# Patient Record
Sex: Male | Born: 1950 | Race: White | Hispanic: No | Marital: Single | Smoking: Current every day smoker
Health system: Southern US, Community
[De-identification: ages and names within clinical notes are randomized; demographics above are authoritative.]

## PROBLEM LIST (undated history)

## (undated) DIAGNOSIS — G8929 Other chronic pain: Secondary | ICD-10-CM

## (undated) DIAGNOSIS — M549 Dorsalgia, unspecified: Secondary | ICD-10-CM

## (undated) DIAGNOSIS — I10 Essential (primary) hypertension: Secondary | ICD-10-CM

## (undated) DIAGNOSIS — F17200 Nicotine dependence, unspecified, uncomplicated: Secondary | ICD-10-CM

## (undated) HISTORY — PX: LUMBAR FUSION: SHX111

---

## 2014-05-07 ENCOUNTER — Emergency Department (HOSPITAL_COMMUNITY)
Admission: EM | Admit: 2014-05-07 | Discharge: 2014-05-07 | Disposition: A | Discharge status: Home / Self Care | Payer: Self-pay | Attending: Emergency Medicine | Admitting: Emergency Medicine

## 2014-05-07 ENCOUNTER — Encounter (HOSPITAL_COMMUNITY): Payer: Self-pay | Admitting: Emergency Medicine

## 2014-05-07 ENCOUNTER — Emergency Department (HOSPITAL_COMMUNITY): Payer: Self-pay

## 2014-05-07 DIAGNOSIS — J209 Acute bronchitis, unspecified: Secondary | ICD-10-CM | POA: Insufficient documentation

## 2014-05-07 DIAGNOSIS — F17200 Nicotine dependence, unspecified, uncomplicated: Secondary | ICD-10-CM | POA: Insufficient documentation

## 2014-05-07 DIAGNOSIS — J40 Bronchitis, not specified as acute or chronic: Secondary | ICD-10-CM

## 2014-05-07 DIAGNOSIS — Z880 Allergy status to penicillin: Secondary | ICD-10-CM | POA: Insufficient documentation

## 2014-05-07 LAB — COMPREHENSIVE METABOLIC PANEL
ALBUMIN: 3.6 g/dL (ref 3.5–5.2)
ALK PHOS: 108 U/L (ref 39–117)
ALT: 28 U/L (ref 0–53)
AST: 30 U/L (ref 0–37)
BUN: 10 mg/dL (ref 6–23)
CALCIUM: 8.6 mg/dL (ref 8.4–10.5)
CO2: 18 mEq/L — ABNORMAL LOW (ref 19–32)
Chloride: 101 mEq/L (ref 96–112)
Creatinine, Ser: 0.98 mg/dL (ref 0.50–1.35)
GFR calc Af Amer: >90 mL/min (ref >90)
GFR calc non Af Amer: 87 mL/min — ABNORMAL LOW (ref >90)
Glucose, Bld: 97 mg/dL (ref 70–99)
POTASSIUM: 4.3 meq/L (ref 3.7–5.3)
Sodium: 135 mEq/L — ABNORMAL LOW (ref 137–147)
TOTAL PROTEIN: 8.5 g/dL — AB (ref 6.0–8.3)
Total Bilirubin: 0.7 mg/dL (ref 0.3–1.2)

## 2014-05-07 LAB — I-STAT TROPONIN, ED: Troponin i, poc: 0.01 ng/mL (ref 0.00–0.08)

## 2014-05-07 LAB — CBC
HCT: 44.2 % (ref 39.0–52.0)
Hemoglobin: 15.2 g/dL (ref 13.0–17.0)
MCH: 32.9 pg (ref 26.0–34.0)
MCHC: 34.4 g/dL (ref 30.0–36.0)
MCV: 95.7 fL (ref 78.0–100.0)
PLATELETS: 168 10*3/uL (ref 150–400)
RBC: 4.62 MIL/uL (ref 4.22–5.81)
RDW: 14.2 % (ref 11.5–15.5)
WBC: 7 10*3/uL (ref 4.0–10.5)

## 2014-05-07 MED ORDER — ALBUTEROL SULFATE HFA 108 (90 BASE) MCG/ACT IN AERS
2.0000 | INHALATION_SPRAY | Freq: Once | RESPIRATORY_TRACT | Status: AC
  Administered 2014-05-07: 2 via RESPIRATORY_TRACT
  Filled 2014-05-07: qty 6.7

## 2014-05-07 MED ORDER — IBUPROFEN 400 MG PO TABS
600.0000 mg | ORAL_TABLET | Freq: Once | ORAL | Status: AC
  Administered 2014-05-07: 600 mg via ORAL
  Filled 2014-05-07 (×2): qty 1

## 2014-05-07 MED ORDER — AZITHROMYCIN 250 MG PO TABS: 250.0000 mg | ORAL_TABLET | Freq: Every day | ORAL | Status: DC

## 2014-05-07 MED ORDER — SODIUM CHLORIDE 0.9 % IV BOLUS (SEPSIS)
1000.0000 mL | Freq: Once | INTRAVENOUS | Status: AC
  Administered 2014-05-07: 1000 mL via INTRAVENOUS

## 2014-05-07 NOTE — ED Notes (Signed)
Pt c/o a sudden onset of mid sharp non radiating chest pain starting this morning. Pt states he was sitting when pain started. Pt reports increase pain with deep inspiration. Denies n/v, diaphoresis, lightheadedness, dizziness. Pt is 1/2 PPD smoker

## 2014-05-07 NOTE — ED Provider Notes (Signed)
CSN: [id number]     Arrival date & time 05/07/14  2007 History   First MD Initiated Contact with Patient 05/07/14 2106     Chief Complaint  Patient presents with  . Chest Pain  . Shortness of Breath     (Consider location/radiation/quality/duration/timing/severity/associated sxs/prior Treatment) Patient is a 63 y.o. male presenting with chest pain. The history is provided by the patient.  Chest Pain Chest pain location: sternum. Pain quality: sharp   Pain radiates to:  Does not radiate Pain radiates to the back: no   Pain severity:  Moderate Onset quality:  Gradual Duration:  1 day Timing:  Constant Progression:  Worsening Chronicity:  Recurrent Context: breathing (and with cough) and at rest   Relieved by:  Nothing Worsened by:  Nothing tried Ineffective treatments:  None tried Associated symptoms: cough and shortness of breath   Associated symptoms: no abdominal pain, no fever, no headache, no palpitations and not vomiting   Risk factors: hypertension and smoking   Risk factors: no coronary artery disease     History reviewed. No pertinent past medical history. History reviewed. No pertinent past surgical history. History reviewed. No pertinent family history. History  Substance Use Topics  . Smoking status: Current Every Day Smoker -- 0.50 packs/day    Types: Cigarettes  . Smokeless tobacco: Former NeurosurgeonUser  . Alcohol Use: Yes    Review of Systems  Constitutional: Negative for fever.  Respiratory: Positive for cough and shortness of breath.   Cardiovascular: Positive for chest pain. Negative for palpitations.  Gastrointestinal: Negative for vomiting and abdominal pain.  Neurological: Negative for headaches.  All other systems reviewed and are negative.     Allergies  Penicillins  Home Medications   Prior to Admission medications   Not on File   BP 167/88  Pulse 71  Temp(Src) 98.4 F (36.9 C) (Oral)  Resp 20  Ht 5\' 11"  (1.803 m)  Wt 210 lb (95.255  kg)  BMI 29.30 kg/m2  SpO2 99% Physical Exam  Constitutional: He is oriented to person, place, and time. He appears well-developed and well-nourished. No distress.  HENT:  Head: Normocephalic and atraumatic.  Eyes: Conjunctivae are normal.  Neck: Neck supple. No tracheal deviation present.  Cardiovascular: Normal rate, regular rhythm and normal heart sounds.   Pulmonary/Chest: Effort normal. No respiratory distress. He has no wheezes. He has rales (bibasilar). He exhibits no tenderness.  Abdominal: Soft. He exhibits no distension. There is no tenderness. There is no rebound and no guarding.  Neurological: He is alert and oriented to person, place, and time.  Skin: Skin is warm and dry.  Psychiatric: He has a normal mood and affect.    ED Course  Procedures (including critical care time) Labs Review Labs Reviewed  COMPREHENSIVE METABOLIC PANEL - Abnormal; Notable for the following:    Sodium 135 (*)    CO2 18 (*)    Total Protein 8.5 (*)    GFR calc non Af Amer 87 (*)    All other components within normal limits  CBC  I-STAT TROPOININ, ED    Imaging Review Dg Chest 2 View  05/07/2014   CLINICAL DATA:  Sudden onset of sharp non radiating mid chest pain this morning, increased with deep inspiration  EXAM: CHEST  2 VIEW  COMPARISON:  None  FINDINGS: Enlargement of cardiac silhouette.  Tortuous aorta.  Pulmonary vascularity normal.  Bronchitic changes with bibasilar atelectasis greater on RIGHT.  No definite infiltrate, pleural effusion or pneumothorax.  Bones  unremarkable.  IMPRESSION: Enlargement of cardiac silhouette.  Bronchitic changes with bibasilar atelectasis greater on RIGHT.   Electronically Signed   By: Ulyses SouthwardMark  Boles M.D.   On: 05/07/2014 21:41     EKG Interpretation   Date/Time:  Tuesday May 07 2014 20:12:30 EDT Ventricular Rate:  80 PR Interval:  134 QRS Duration: 128 QT Interval:  432 QTC Calculation: 498 R Axis:   0 Text Interpretation:  Normal sinus rhythm Right  bundle branch block  Abnormal ECG No previous ECGs available Confirmed by YAO  MD, DAVID  ([id number]) on 05/07/2014 8:51:10 PM      MDM   Final diagnoses:  Bronchitis   63 y.o. male presents with cough and pain with inspiration that started today. He has had symptoms that were similar in the past and received antibiotics with improvement. He was unable to make an appointment with his regular physician today so comes here for evaluation. He is experiencing a sharp sternal pain that is worse with coughing and with deep inspiration. He is a heavy smoker and been noncompliant hypertensive. He is afebrile on arrival, his chest x-ray is concerning for infection but does show some bibasilar atelectasis and changes of bronchitis that would be consistent with his clinical picture today. His troponin is negative and I do not feel that this pain is concerning for ACS. The patient was prescribed antibiotics to cover for atypicals in the setting of mild bronchitis and I recommended NSAIDs for his apparent sternal pain. He was given an albuterol inhaler for comfort at home and asked to follow up with primary care physician within the week for recheck. Notably on his screening lab work he had a mildly low bicarbonate which could be attributable to alcohol use or dehydration and he was given an IV fluid bolus to correct this.    Lyndal Pulleyaniel Kushi Kun, MD 05/08/14 [phone]

## 2014-05-07 NOTE — ED Notes (Signed)
Patient is alert and orientedx4.  Patient was explained discharge instructions and they understood them with no questions.  The patient's maid, Franchot MimesSharon Robbins is taking the patient home.

## 2014-05-07 NOTE — Discharge Instructions (Signed)

## 2014-05-07 NOTE — ED Notes (Signed)
Patient transported to X-ray 

## 2014-05-08 NOTE — ED Provider Notes (Signed)
I saw and evaluated the patient, reviewed the resident's note and I agree with the findings and plan.   EKG Interpretation   Date/Time:  Tuesday May 07 2014 20:12:30 EDT Ventricular Rate:  80 PR Interval:  134 QRS Duration: 128 QT Interval:  432 QTC Calculation: 498 R Axis:   0 Text Interpretation:  Normal sinus rhythm Right bundle branch block  Abnormal ECG No previous ECGs available Confirmed by Joziah Dollins  MD, Aarush Stukey  ([id number]) on 05/07/2014 8:51:10 PM      Donald Watkins is a 63 y.o. male smoker here with cough. Cough and congestion and chest pain worse with inspiration today. Had bronchitis before that improved with abx. Vitals stable. + wheezing R side and diminished breath sounds R base. He also drinks alcohol so bicarb 18. Given 1L NS and nebs. Given that he is a smoker, will d/c home on zpack and prn albuterol.    Donald Canalavid H Yvonda Fouty, MD 05/08/14 [phone]

## 2018-07-20 ENCOUNTER — Other Ambulatory Visit: Payer: Self-pay | Admitting: Physician Assistant

## 2018-07-20 DIAGNOSIS — Z136 Encounter for screening for cardiovascular disorders: Secondary | ICD-10-CM

## 2018-07-27 ENCOUNTER — Ambulatory Visit
Admission: RE | Admit: 2018-07-27 | Discharge: 2018-07-27 | Disposition: A | Discharge status: Home / Self Care | Payer: Medicare Other | Source: Ambulatory Visit | Attending: Physician Assistant | Admitting: Physician Assistant

## 2018-07-27 DIAGNOSIS — Z136 Encounter for screening for cardiovascular disorders: Secondary | ICD-10-CM

## 2018-10-27 ENCOUNTER — Encounter (HOSPITAL_BASED_OUTPATIENT_CLINIC_OR_DEPARTMENT_OTHER): Payer: Self-pay | Admitting: *Deleted

## 2018-10-27 ENCOUNTER — Other Ambulatory Visit: Payer: Self-pay

## 2018-10-31 ENCOUNTER — Encounter (HOSPITAL_BASED_OUTPATIENT_CLINIC_OR_DEPARTMENT_OTHER)
Admission: RE | Admit: 2018-10-31 | Discharge: 2018-10-31 | Disposition: A | Discharge status: Home / Self Care | Payer: Medicare Other | Source: Ambulatory Visit | Attending: Oral Surgery | Admitting: Oral Surgery

## 2018-10-31 DIAGNOSIS — Z01812 Encounter for preprocedural laboratory examination: Secondary | ICD-10-CM | POA: Diagnosis present

## 2018-10-31 DIAGNOSIS — I10 Essential (primary) hypertension: Secondary | ICD-10-CM | POA: Diagnosis not present

## 2018-10-31 DIAGNOSIS — K029 Dental caries, unspecified: Secondary | ICD-10-CM | POA: Diagnosis present

## 2018-10-31 DIAGNOSIS — F17200 Nicotine dependence, unspecified, uncomplicated: Secondary | ICD-10-CM | POA: Diagnosis not present

## 2018-10-31 DIAGNOSIS — K0530 Chronic periodontitis, unspecified: Secondary | ICD-10-CM | POA: Diagnosis not present

## 2018-10-31 LAB — BASIC METABOLIC PANEL
Anion gap: 8 (ref 5–15)
BUN: 16 mg/dL (ref 8–23)
CALCIUM: 8.9 mg/dL (ref 8.9–10.3)
CO2: 28 mmol/L (ref 22–32)
CREATININE: 2.54 mg/dL — AB (ref 0.61–1.24)
Chloride: 101 mmol/L (ref 98–111)
GFR calc non Af Amer: 25 mL/min — ABNORMAL LOW (ref >60)
GFR, EST AFRICAN AMERICAN: 29 mL/min — AB (ref >60)
Glucose, Bld: 85 mg/dL (ref 70–99)
Potassium: 3.9 mmol/L (ref 3.5–5.1)
SODIUM: 137 mmol/L (ref 135–145)

## 2018-10-31 NOTE — H&P (Signed)
HISTORY AND PHYSICAL  Donald Watkins is a 67 y.o. male patient with CC: referred by general dentist for full mouth extractions.   No diagnosis found.  Past Medical History:  Diagnosis Date  . Chronic back pain    [facility]  . Hypertension   . Smoker     No current facility-administered medications for this encounter.    Current Outpatient Medications  Medication Sig Dispense Refill  . hydrochlorothiazide (HYDRODIURIL) 25 MG tablet Take 25 mg by mouth daily.    Marland Kitchen. HYDROcodone-acetaminophen (NORCO/VICODIN) 5-325 MG tablet Take 1 tablet by mouth every 6 (six) hours as needed for moderate pain.    Marland Kitchen. losartan (COZAAR) 100 MG tablet Take 100 mg by mouth daily.    Marland Kitchen. tiZANidine (ZANAFLEX) 4 MG tablet Take 4 mg by mouth every 6 (six) hours as needed for muscle spasms.     Allergies  Allergen Reactions  . Penicillins Hives   Active Problems:   * No active hospital problems. *  Vitals: Height 5\' 10"  (1.778 m), weight 88 kg. Lab results:No results found for this or any previous visit (from the past 24 hour(s)). Radiology Results: No results found. General appearance: alert and cooperative Head: Normocephalic, without obvious abnormality, atraumatic Eyes: negative Nose: Nares normal. Septum midline. Mucosa normal. No drainage or sinus tenderness. Throat: rampant dental caries, periodontitis. Pharynx clear. 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   Plan: Full mouth extractions with alveoloplasty. GA. Day surgery.    Donald Watkins 10/31/2018

## 2018-11-01 ENCOUNTER — Encounter (HOSPITAL_BASED_OUTPATIENT_CLINIC_OR_DEPARTMENT_OTHER)
Admission: RE | Disposition: A | Discharge status: Home / Self Care | Payer: Self-pay | Source: Ambulatory Visit | Attending: Oral Surgery

## 2018-11-01 ENCOUNTER — Other Ambulatory Visit: Payer: Self-pay

## 2018-11-01 ENCOUNTER — Ambulatory Visit (HOSPITAL_BASED_OUTPATIENT_CLINIC_OR_DEPARTMENT_OTHER)
Admission: RE | Admit: 2018-11-01 | Discharge: 2018-11-01 | Disposition: A | Discharge status: Home / Self Care | Payer: Medicare Other | Source: Ambulatory Visit | Attending: Oral Surgery | Admitting: Oral Surgery

## 2018-11-01 ENCOUNTER — Encounter (HOSPITAL_BASED_OUTPATIENT_CLINIC_OR_DEPARTMENT_OTHER): Payer: Self-pay | Admitting: *Deleted

## 2018-11-01 ENCOUNTER — Ambulatory Visit (HOSPITAL_BASED_OUTPATIENT_CLINIC_OR_DEPARTMENT_OTHER): Payer: Medicare Other | Admitting: Anesthesiology

## 2018-11-01 DIAGNOSIS — K0530 Chronic periodontitis, unspecified: Secondary | ICD-10-CM | POA: Insufficient documentation

## 2018-11-01 DIAGNOSIS — F17200 Nicotine dependence, unspecified, uncomplicated: Secondary | ICD-10-CM | POA: Diagnosis not present

## 2018-11-01 DIAGNOSIS — I10 Essential (primary) hypertension: Secondary | ICD-10-CM | POA: Insufficient documentation

## 2018-11-01 DIAGNOSIS — K029 Dental caries, unspecified: Secondary | ICD-10-CM | POA: Diagnosis not present

## 2018-11-01 HISTORY — DX: Nicotine dependence, unspecified, uncomplicated: F17.200

## 2018-11-01 HISTORY — PX: MULTIPLE EXTRACTIONS WITH ALVEOLOPLASTY: SHX5342

## 2018-11-01 HISTORY — DX: Essential (primary) hypertension: I10

## 2018-11-01 HISTORY — DX: Other chronic pain: G89.29

## 2018-11-01 HISTORY — DX: Dorsalgia, unspecified: M54.9

## 2018-11-01 LAB — POCT I-STAT, CHEM 8
BUN: 17 mg/dL (ref 8–23)
CHLORIDE: 103 mmol/L (ref 98–111)
Calcium, Ion: 1.08 mmol/L — ABNORMAL LOW (ref 1.15–1.40)
Creatinine, Ser: 1.5 mg/dL — ABNORMAL HIGH (ref 0.61–1.24)
GLUCOSE: 94 mg/dL (ref 70–99)
HCT: 46 % (ref 39.0–52.0)
Hemoglobin: 15.6 g/dL (ref 13.0–17.0)
POTASSIUM: 3 mmol/L — AB (ref 3.5–5.1)
SODIUM: 139 mmol/L (ref 135–145)
TCO2: 25 mmol/L (ref 22–32)

## 2018-11-01 SURGERY — MULTIPLE EXTRACTION WITH ALVEOLOPLASTY
Anesthesia: General | Laterality: Bilateral

## 2018-11-01 MED ORDER — MIDAZOLAM HCL 2 MG/2ML IJ SOLN
1.0000 mg | INTRAMUSCULAR | Status: DC | PRN
  Administered 2018-11-01: 2 mg via INTRAVENOUS

## 2018-11-01 MED ORDER — SUCCINYLCHOLINE CHLORIDE 20 MG/ML IJ SOLN
INTRAMUSCULAR | Status: DC | PRN
  Administered 2018-11-01: 100 mg via INTRAVENOUS

## 2018-11-01 MED ORDER — CLINDAMYCIN PHOSPHATE 600 MG/50ML IV SOLN
600.0000 mg | Freq: Once | INTRAVENOUS | Status: AC
  Administered 2018-11-01: 600 mg via INTRAVENOUS

## 2018-11-01 MED ORDER — LIDOCAINE-EPINEPHRINE 2 %-1:100000 IJ SOLN
INTRAMUSCULAR | Status: DC | PRN
  Administered 2018-11-01: 20 mL

## 2018-11-01 MED ORDER — MIDAZOLAM HCL 2 MG/2ML IJ SOLN
INTRAMUSCULAR | Status: AC
  Filled 2018-11-01: qty 2

## 2018-11-01 MED ORDER — EPHEDRINE SULFATE 50 MG/ML IJ SOLN
INTRAMUSCULAR | Status: DC | PRN
  Administered 2018-11-01: 10 mg via INTRAVENOUS

## 2018-11-01 MED ORDER — CLINDAMYCIN PHOSPHATE 600 MG/50ML IV SOLN
INTRAVENOUS | Status: AC
  Filled 2018-11-01: qty 50

## 2018-11-01 MED ORDER — FENTANYL CITRATE (PF) 100 MCG/2ML IJ SOLN
25.0000 ug | INTRAMUSCULAR | Status: DC | PRN
  Administered 2018-11-01: 50 ug via INTRAVENOUS

## 2018-11-01 MED ORDER — DEXAMETHASONE SODIUM PHOSPHATE 10 MG/ML IJ SOLN
INTRAMUSCULAR | Status: AC
  Filled 2018-11-01: qty 1

## 2018-11-01 MED ORDER — LIDOCAINE 2% (20 MG/ML) 5 ML SYRINGE
INTRAMUSCULAR | Status: DC | PRN
  Administered 2018-11-01: 100 mg via INTRAVENOUS

## 2018-11-01 MED ORDER — ONDANSETRON HCL 4 MG/2ML IJ SOLN
INTRAMUSCULAR | Status: AC
  Filled 2018-11-01: qty 2

## 2018-11-01 MED ORDER — MEPERIDINE HCL 25 MG/ML IJ SOLN: 6.2500 mg | INTRAMUSCULAR | Status: DC | PRN

## 2018-11-01 MED ORDER — ONDANSETRON HCL 4 MG/2ML IJ SOLN
INTRAMUSCULAR | Status: DC | PRN
  Administered 2018-11-01: 4 mg via INTRAVENOUS

## 2018-11-01 MED ORDER — SODIUM CHLORIDE 0.9 % IR SOLN
Status: DC | PRN
  Administered 2018-11-01: 500 mL

## 2018-11-01 MED ORDER — CLINDAMYCIN HCL 300 MG PO CAPS: 300.0000 mg | ORAL_CAPSULE | Freq: Three times a day (TID) | ORAL | 0 refills | Status: AC

## 2018-11-01 MED ORDER — OXYCODONE HCL 5 MG/5ML PO SOLN: 5.0000 mg | Freq: Once | ORAL | Status: DC | PRN

## 2018-11-01 MED ORDER — LIDOCAINE 2% (20 MG/ML) 5 ML SYRINGE
INTRAMUSCULAR | Status: AC
  Filled 2018-11-01: qty 5

## 2018-11-01 MED ORDER — FENTANYL CITRATE (PF) 100 MCG/2ML IJ SOLN
50.0000 ug | INTRAMUSCULAR | Status: DC | PRN
  Administered 2018-11-01: 100 ug via INTRAVENOUS

## 2018-11-01 MED ORDER — SCOPOLAMINE 1 MG/3DAYS TD PT72: 1.0000 | MEDICATED_PATCH | Freq: Once | TRANSDERMAL | Status: DC | PRN

## 2018-11-01 MED ORDER — FENTANYL CITRATE (PF) 100 MCG/2ML IJ SOLN
INTRAMUSCULAR | Status: AC
  Filled 2018-11-01: qty 2

## 2018-11-01 MED ORDER — PROPOFOL 10 MG/ML IV BOLUS
INTRAVENOUS | Status: DC | PRN
  Administered 2018-11-01: 150 mg via INTRAVENOUS

## 2018-11-01 MED ORDER — LACTATED RINGERS IV SOLN
INTRAVENOUS | Status: DC
  Administered 2018-11-01 (×2): via INTRAVENOUS

## 2018-11-01 MED ORDER — OXYCODONE-ACETAMINOPHEN 5-325 MG PO TABS: 1.0000 | ORAL_TABLET | ORAL | 0 refills | Status: AC | PRN

## 2018-11-01 MED ORDER — OXYCODONE HCL 5 MG PO TABS: 5.0000 mg | ORAL_TABLET | Freq: Once | ORAL | Status: DC | PRN

## 2018-11-01 MED ORDER — DEXAMETHASONE SODIUM PHOSPHATE 4 MG/ML IJ SOLN
INTRAMUSCULAR | Status: DC | PRN
  Administered 2018-11-01: 10 mg via INTRAVENOUS

## 2018-11-01 SURGICAL SUPPLY — 49 items
BLADE DERMATOME SS (BLADE) IMPLANT
BLADE SURG 15 STRL LF DISP TIS (BLADE) ×1 IMPLANT
BLADE SURG 15 STRL SS (BLADE) ×3
BUR CROSS CUT FISSURE 1.6 (BURR) ×2 IMPLANT
BUR CROSS CUT FISSURE 1.6MM (BURR) ×1
BUR EGG ELITE 4.0 (BURR) ×2 IMPLANT
BUR EGG ELITE 4.0MM (BURR) ×1
CANISTER SUCT 1200ML W/VALVE (MISCELLANEOUS) ×3 IMPLANT
CATH ROBINSON RED A/P 10FR (CATHETERS) IMPLANT
CLOSURE WOUND 1/2 X4 (GAUZE/BANDAGES/DRESSINGS)
COVER BACK TABLE 60X90IN (DRAPES) ×3 IMPLANT
COVER MAYO STAND STRL (DRAPES) ×3 IMPLANT
COVER WAND RF STERILE (DRAPES) IMPLANT
DECANTER SPIKE VIAL GLASS SM (MISCELLANEOUS) IMPLANT
DEPRESSOR TONGUE BLADE STERILE (MISCELLANEOUS) IMPLANT
DRAPE U-SHAPE 76X120 STRL (DRAPES) ×3 IMPLANT
DRSG TEGADERM 4X10 (GAUZE/BANDAGES/DRESSINGS) IMPLANT
GAUZE PACKING FOLDED 2  STR (GAUZE/BANDAGES/DRESSINGS) ×2
GAUZE PACKING FOLDED 2 STR (GAUZE/BANDAGES/DRESSINGS) ×1 IMPLANT
GAUZE PACKING IODOFORM 1/4X15 (GAUZE/BANDAGES/DRESSINGS) IMPLANT
GLOVE BIO SURGEON STRL SZ 6.5 (GLOVE) ×2 IMPLANT
GLOVE BIO SURGEON STRL SZ7.5 (GLOVE) ×3 IMPLANT
GLOVE BIO SURGEONS STRL SZ 6.5 (GLOVE) ×1
GLOVE BIOGEL PI IND STRL 6.5 (GLOVE) ×1 IMPLANT
GLOVE BIOGEL PI INDICATOR 6.5 (GLOVE) ×2
GOWN STRL REUS W/ TWL LRG LVL3 (GOWN DISPOSABLE) ×1 IMPLANT
GOWN STRL REUS W/ TWL XL LVL3 (GOWN DISPOSABLE) ×1 IMPLANT
GOWN STRL REUS W/TWL LRG LVL3 (GOWN DISPOSABLE) ×3
GOWN STRL REUS W/TWL XL LVL3 (GOWN DISPOSABLE) ×3
IV NS 500ML (IV SOLUTION) ×3
IV NS 500ML BAXH (IV SOLUTION) ×1 IMPLANT
NEEDLE HYPO 22GX1.5 SAFETY (NEEDLE) ×5 IMPLANT
NS IRRIG 1000ML POUR BTL (IV SOLUTION) ×3 IMPLANT
PACK BASIN DAY SURGERY FS (CUSTOM PROCEDURE TRAY) ×3 IMPLANT
SLEEVE SCD COMPRESS KNEE MED (MISCELLANEOUS) ×2 IMPLANT
SPONGE SURGIFOAM ABS GEL 12-7 (HEMOSTASIS) IMPLANT
STRIP CLOSURE SKIN 1/2X4 (GAUZE/BANDAGES/DRESSINGS) IMPLANT
SUT CHROMIC 3 0 PS 2 (SUTURE) ×5 IMPLANT
SYR 20CC LL (SYRINGE) IMPLANT
SYR BULB 3OZ (MISCELLANEOUS) ×3 IMPLANT
SYR CONTROL 10ML LL (SYRINGE) ×5 IMPLANT
TOOTHBRUSH ADULT (PERSONAL CARE ITEMS) IMPLANT
TOWEL GREEN STERILE FF (TOWEL DISPOSABLE) ×3 IMPLANT
TOWEL OR NON WOVEN STRL DISP B (DISPOSABLE) ×3 IMPLANT
TRAY DSU PREP LF (CUSTOM PROCEDURE TRAY) IMPLANT
TUBE CONNECTING 20'X1/4 (TUBING) ×1
TUBE CONNECTING 20X1/4 (TUBING) ×2 IMPLANT
TUBING IRRIGATION (MISCELLANEOUS) ×3 IMPLANT
YANKAUER SUCT BULB TIP NO VENT (SUCTIONS) ×3 IMPLANT

## 2018-11-01 NOTE — Anesthesia Preprocedure Evaluation (Addendum)
Anesthesia Evaluation  Patient identified by MRN, date of birth, ID band Patient awake    Reviewed: Allergy & Precautions, NPO status , Patient's Chart, lab work & pertinent test results  Airway Mallampati: II  TM Distance: >3 FB Neck ROM: Full    Dental  (+) Dental Advisory Given, Poor Dentition   Pulmonary Current Smoker,    Pulmonary exam normal breath sounds clear to auscultation       Cardiovascular hypertension, negative cardio ROS Normal cardiovascular exam Rhythm:Regular Rate:Normal     Neuro/Psych negative neurological ROS  negative psych ROS   GI/Hepatic negative GI ROS, Neg liver ROS,   Endo/Other  negative endocrine ROS  Renal/GU Renal diseaseCr. 2.5 on labs 11/12, but 1.5 on iStat today.     Musculoskeletal negative musculoskeletal ROS (+)   Abdominal   Peds  Hematology negative hematology ROS (+)   Anesthesia Other Findings   Reproductive/Obstetrics                            Anesthesia Physical Anesthesia Plan  ASA: III  Anesthesia Plan: General   Post-op Pain Management:    Induction: Intravenous  PONV Risk Score and Plan: 2 and Ondansetron, Dexamethasone and Treatment may vary due to age or medical condition  Airway Management Planned: Nasal ETT  Additional Equipment:   Intra-op Plan:   Post-operative Plan: Extubation in OR  Informed Consent: I have reviewed the patients History and Physical, chart, labs and discussed the procedure including the risks, benefits and alternatives for the proposed anesthesia with the patient or authorized representative who has indicated his/her understanding and acceptance.   Dental advisory given  Plan Discussed with: CRNA  Anesthesia Plan Comments:       Anesthesia Quick Evaluation

## 2018-11-01 NOTE — Transfer of Care (Signed)
Immediate Anesthesia Transfer of Care Note  Patient: Donald Watkins  Procedure(s) Performed: MULTIPLE EXTRACTION WITH ALVEOLOPLASTY (Bilateral )  Patient Location: PACU  Anesthesia Type:General  Level of Consciousness: awake, alert  and oriented  Airway & Oxygen Therapy: Patient Spontanous Breathing and Patient connected to face mask oxygen  Post-op Assessment: Report given to RN and Post -op Vital signs reviewed and stable  Post vital signs: Reviewed and stable  Last Vitals:  Vitals Value Taken Time  BP 143/76 11/01/2018  2:19 PM  Temp    Pulse 77 11/01/2018  2:21 PM  Resp 17 11/01/2018  2:21 PM  SpO2 100 % 11/01/2018  2:21 PM  Vitals shown include unvalidated device data.  Last Pain:  Vitals:   11/01/18 1138  TempSrc: Oral  PainSc: 0-No pain      Patients Stated Pain Goal: 3 (11/01/18 1138)  Complications: No apparent anesthesia complications

## 2018-11-01 NOTE — Op Note (Signed)
11/01/2018  2:09 PM  PATIENT:  Donald Watkins  67 y.o. male  PRE-OPERATIVE DIAGNOSIS:  NON-RESTORABLE TEETH # 2, 3, 4, 5, 6, 7, 8, 9, 10, 11, 12, 13, 14, 15, 19, 20, 21, 22, 23, 26, 27, 28, 29, 30 POST-OPERATIVE DIAGNOSIS:  SAME  PROCEDURE:  Procedure(s): MULTIPLE EXTRACTION TEETH # 2, 3, 4, 5, 6, 7, 8, 9, 10, 11, 12, 13, 14, 15, 19, 20, 21, 22, 23, 26, 27, 28, 29, 30 ; ALVEOLOPLASTY RIGHT AND LEFT MAXILLA AND MANDIBLE  SURGEON:  Surgeon(s): Ocie DoyneJensen, Hanako Tipping, DDS  ANESTHESIA:   local and general  EBL:  minimal  DRAINS: none   SPECIMEN:  No Specimen  COUNTS:  YES  PLAN OF CARE: Discharge to home after PACU  PATIENT DISPOSITION:  PACU - hemodynamically stable.   PROCEDURE DETAILS: Dictation # [id number]  Donald Watkins, DMD 11/01/2018 2:09 PM

## 2018-11-01 NOTE — Discharge Instructions (Signed)

## 2018-11-01 NOTE — Anesthesia Procedure Notes (Signed)
Procedure Name: Intubation Date/Time: 11/01/2018 1:24 PM Performed by: Maryella Shivers, CRNA Pre-anesthesia Checklist: Patient identified, Emergency Drugs available, Suction available and Patient being monitored Patient Re-evaluated:Patient Re-evaluated prior to induction Oxygen Delivery Method: Circle system utilized Preoxygenation: Pre-oxygenation with 100% oxygen Induction Type: IV induction Ventilation: Mask ventilation without difficulty Laryngoscope Size: Mac and 4 Grade View: Grade I Nasal Tubes: Nasal prep performed, Nasal Rae and Right Tube size: 7.0 mm Placement Confirmation: ETT inserted through vocal cords under direct vision,  positive ETCO2 and breath sounds checked- equal and bilateral Secured at: 26 cm Tube secured with: Tape Dental Injury: Teeth and Oropharynx as per pre-operative assessment

## 2018-11-01 NOTE — H&P (Signed)
H&P documentation  -History and Physical Reviewed  -Patient has been re-examined  -No change in the plan of care  Donald Watkins  

## 2018-11-02 ENCOUNTER — Encounter (HOSPITAL_BASED_OUTPATIENT_CLINIC_OR_DEPARTMENT_OTHER): Payer: Self-pay | Admitting: Oral Surgery

## 2018-11-02 NOTE — Op Note (Signed)
NAME: Donald Watkins, Donald W. MEDICAL RECORD [id number] ACCOUNT [id number] DATE OF BIRTH:03-11-1951 FACILITY: [facility] LOCATION: [facility] PHYSICIAN:Angie Piercey M. 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  PROCEDURE:  Extraction of teeth numbers 2, 3, 4, 5, 6, 7, 8, 9, 10, 11, 12, 13, 14, 15, 19, 20, 21, 22, 23, 26, 27, 28, 29, 30, alveoplasty right and left maxilla and mandible.  SURGEON:  Ocie DoyneScott Tarika Mckethan, DDS  ANESTHESIA:  General, Dr. Renold DonGermeroth attending  DESCRIPTION OF PROCEDURE:  The patient was taken to the operating room and placed on the table in supine position.  General anesthesia was administered, and a nasal endotracheal tube was placed and secured.  The eyes were protected, and the patient was  draped for surgery.  A timeout was performed.  The posterior pharynx was suctioned, and a throat pack was placed.  Lidocaine 2% 1:100,000 epinephrine was infiltrated in an inferior alveolar block on the right and left sides and in buccal and palatal  infiltration in the maxilla and in buccal infiltration of the mandible.  A total of 20 mL was utilized.  A bite block was placed on the right side of the mouth, and a sweetheart retractor was used to retract the tongue.  A #15 blade was used to make an  incision 1 cm proximal to tooth #19 along the alveolar crest, and then this incision was carried forward both buccally and lingually around teeth numbers 19, 20, 21, 22 and 23.  In the maxilla, the 15 blade was used to make an incision starting at tooth  #15. carried forward in the gingival sulcus both buccally and palatally, across the midline until tooth #7.  The periosteum was reflected from around the teeth in the left maxilla and mandible with a periosteal elevator.  The teeth were elevated with a   301 elevator, and the majority of the teeth were removed using dental forceps.  Tooth #21 fractured during removal and required additional removal of bone to remove the remaining root and then tooth #14 fractured, requiring removal of bone to remove the  mesial buccal root tip.  Tooth #11 required removal of additional bone so that the forcep could grasp around the circumference of the root of the tooth, and then this tooth was removed.  Then, the sockets were curetted.  The periosteum was reflected in  the left maxilla and mandible to expose the alveolar crest, and then alveoplasty was performed using an egg-shaped bur followed by a bone file.  Then, the areas were irrigated and closed with 3-0 chromic.  The sweetheart retractor and bite block were  repositioned to the other side of the mouth, and a 15 blade was used to make an incision around teeth numbers 2, 3, 4, 5 and 6 in the maxilla and around teeth numbers 26, 27, 28, 29, 30 in the mandible.  The periosteum was reflected.  The teeth were  elevated.  Tooth #30 fractured, requiring removal of additional bone to remove the roots.  Then, the remaining teeth were removed with the dental forceps.  The sockets were curetted.  Periosteum was reflected to expose the alveolar crest, and then  alveoplasty was performed in the right maxilla and mandible using an egg-shaped bur and bone file.  The area was irrigated and  closed with 3-0 chromic.  The oral cavity was then inspected and found to have good contour, hemostasis and closure.  The oral  cavity was irrigated and suctioned and throat pack was removed.  The patient was left in care of anesthesia for awakening, extubation and transport to recovery room with plans for discharge home through day surgery.  ESTIMATED BLOOD LOSS:  Minimal.  COMPLICATIONS:  None.  SPECIMENS:  None.  LN/NUANCE  D:11/01/2018 T:11/02/2018 JOB:[id number]

## 2018-11-02 NOTE — Anesthesia Postprocedure Evaluation (Signed)
Anesthesia Post Note  Patient: Donald Watkins  Procedure(s) Performed: MULTIPLE EXTRACTION WITH ALVEOLOPLASTY (Bilateral )     Patient location during evaluation: PACU Anesthesia Type: General Level of consciousness: awake and alert, awake and oriented Pain management: pain level controlled Vital Signs Assessment: post-procedure vital signs reviewed and stable Respiratory status: spontaneous breathing, nonlabored ventilation and respiratory function stable Cardiovascular status: blood pressure returned to baseline and stable Postop Assessment: no apparent nausea or vomiting Anesthetic complications: no    Last Vitals:  Vitals:   11/01/18 1520 11/01/18 1540  BP:  (!) 156/86  Pulse: 72 70  Resp: 12 18  Temp:  36.5 C  SpO2: 96% 96%    Last Pain:  Vitals:   11/02/18 0955  TempSrc:   PainSc: 0-No pain                 Cecile HearingStephen Edward Turk

## 2019-11-19 ENCOUNTER — Other Ambulatory Visit: Payer: Self-pay | Admitting: Physician Assistant

## 2019-11-19 DIAGNOSIS — Z122 Encounter for screening for malignant neoplasm of respiratory organs: Secondary | ICD-10-CM

## 2019-11-29 ENCOUNTER — Ambulatory Visit: Payer: Medicare Other

## 2019-12-11 IMAGING — US US ABDOMINAL AORTA SCREENING AAA
1 series · 7 of 7 positions shown · non-contrast
Comparison: None.

CLINICAL DATA: 66-year-old male with a history of smoking

EXAM:
ULTRASOUND OF ABDOMINAL AORTA
TECHNIQUE: Ultrasound examination of the abdominal aorta was performed to
evaluate for abdominal aortic aneurysm.

[Series 1: us abdominal aorta screening aaa · 0.23mm/px · 7 of 7 slices shown]
[im 1/7]
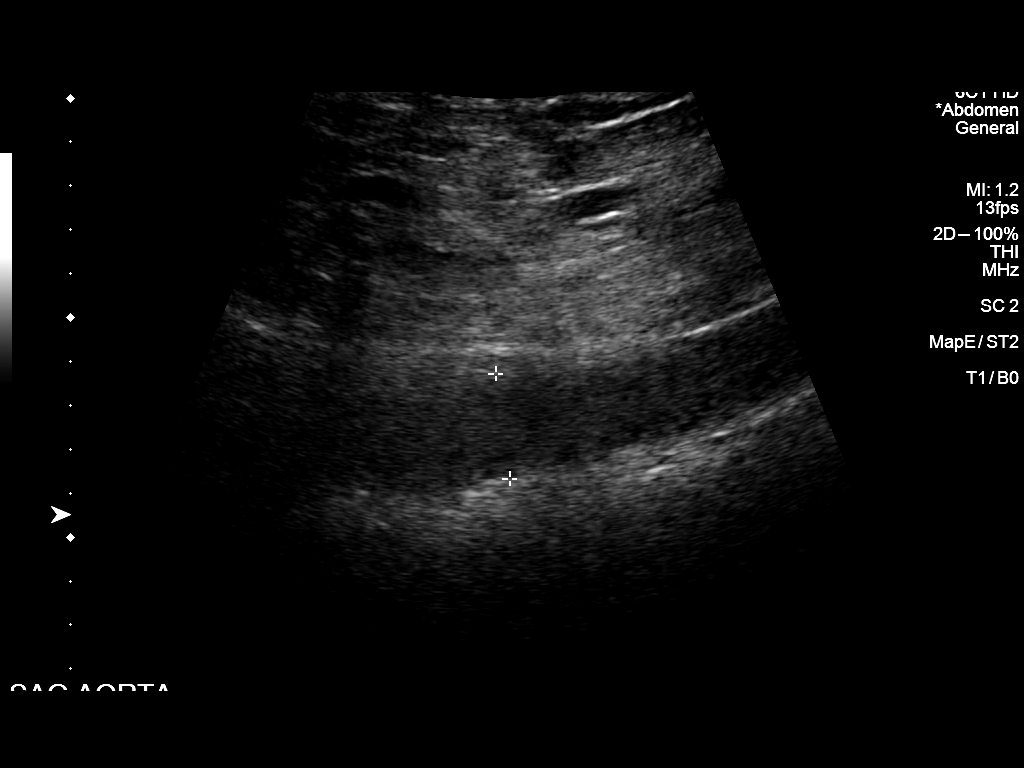
[im 2/7]
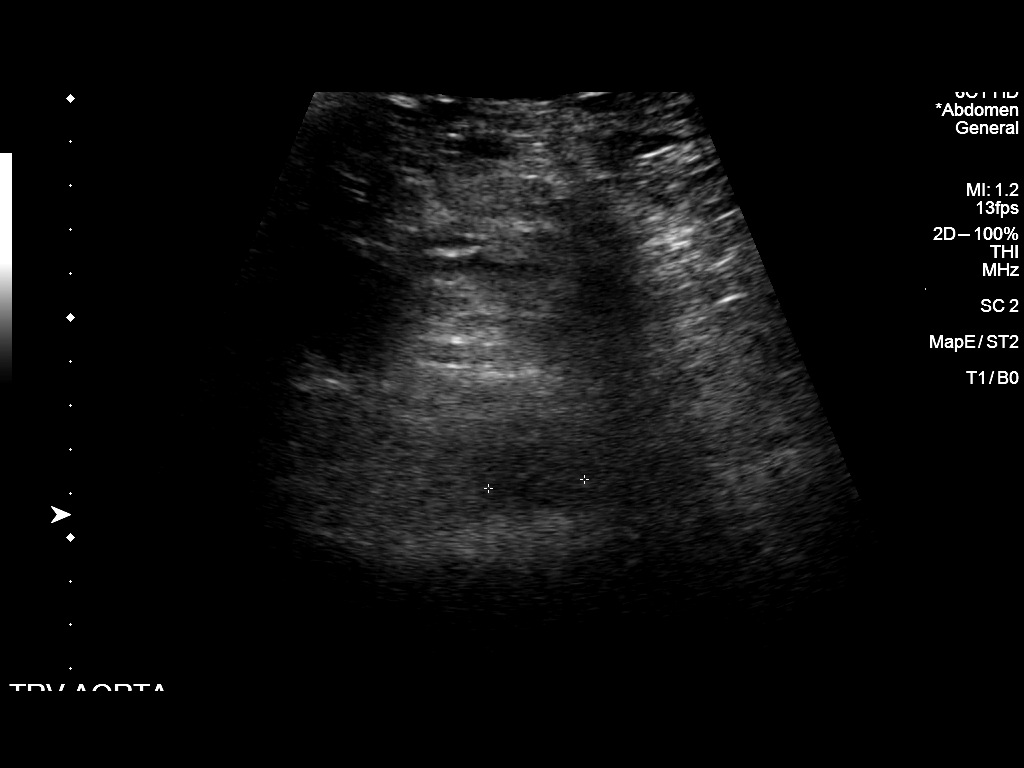
[im 3/7]
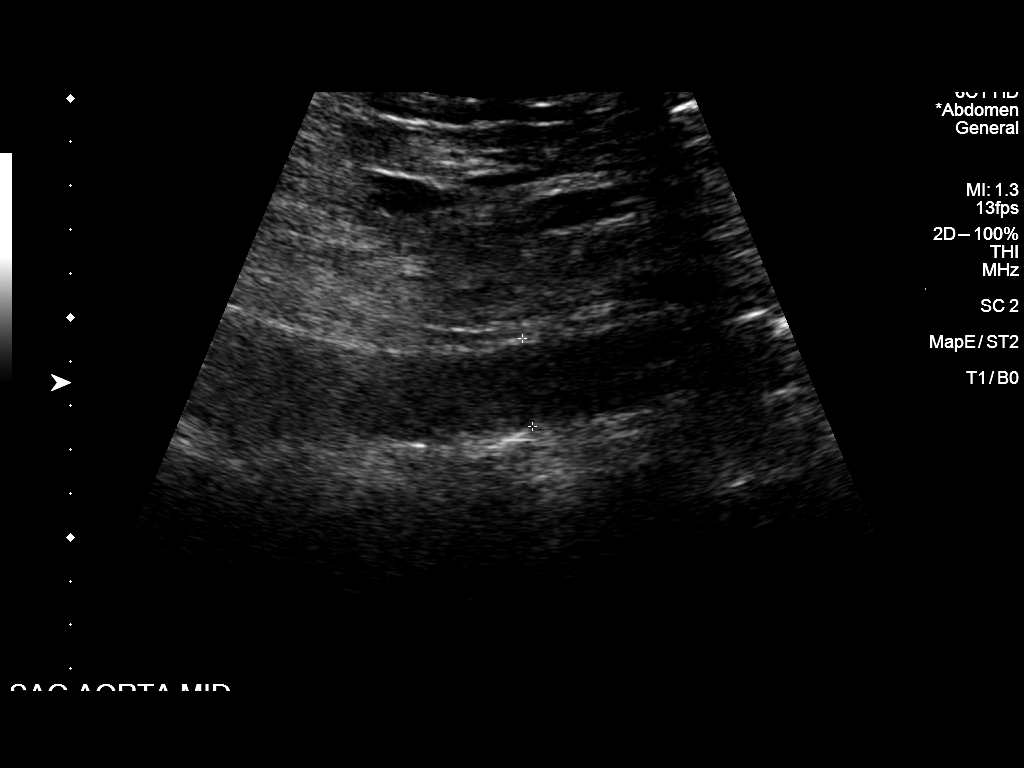
[im 4/7]
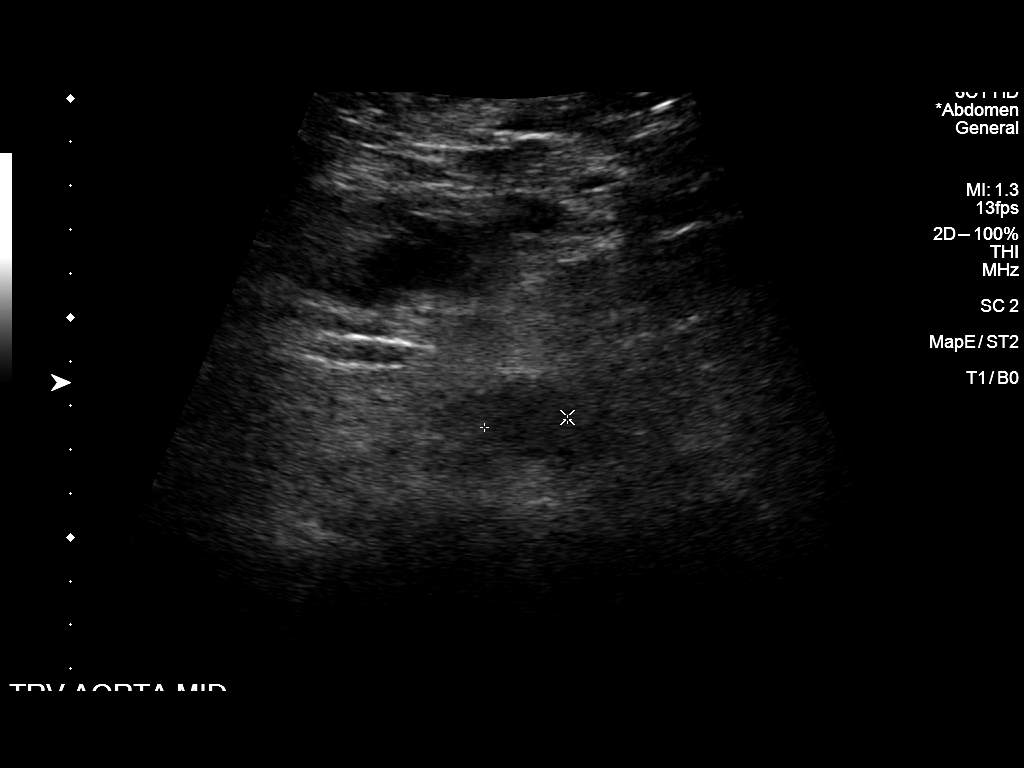
[im 5/7]
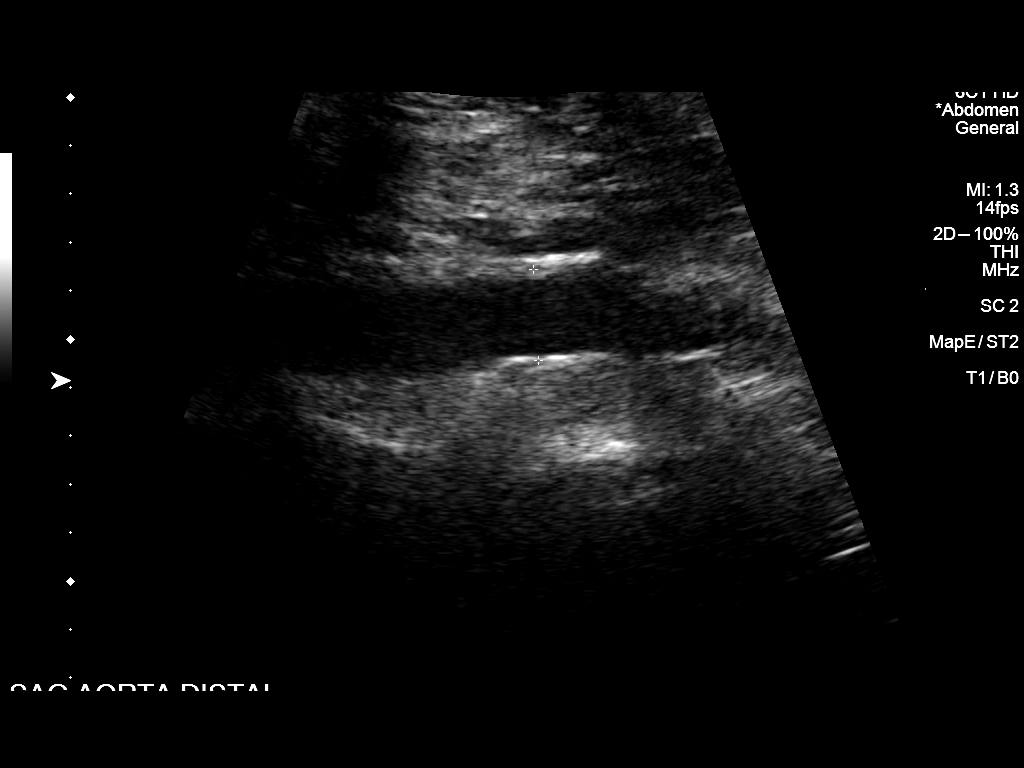
[im 6/7]
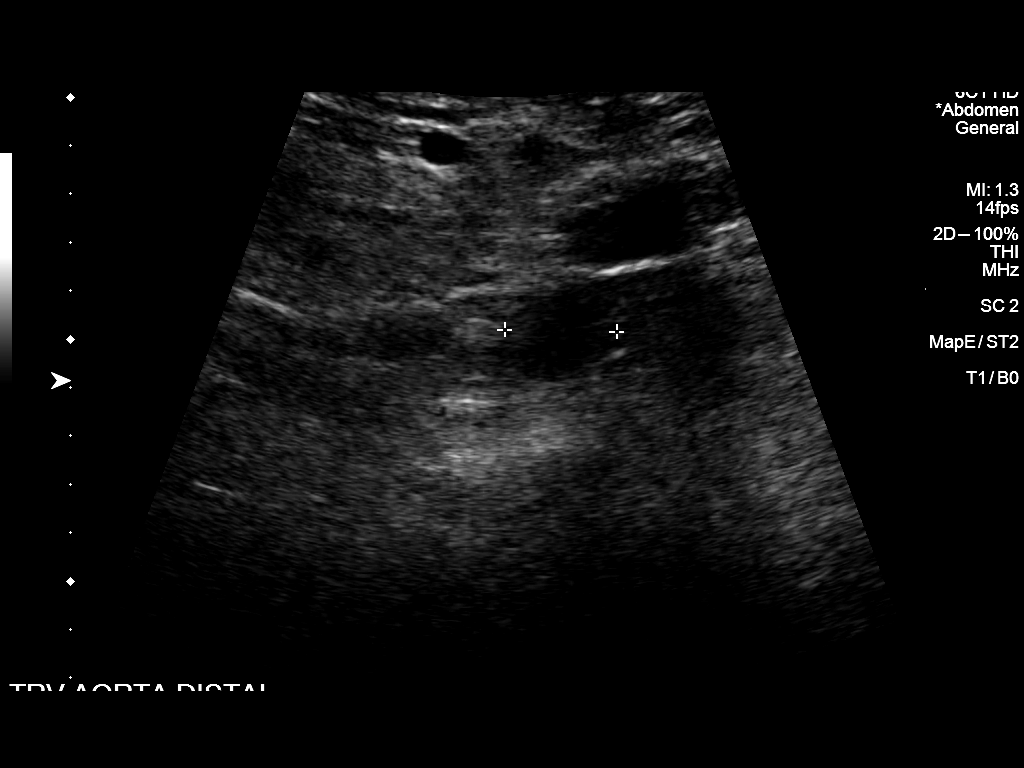
[im 7/7]
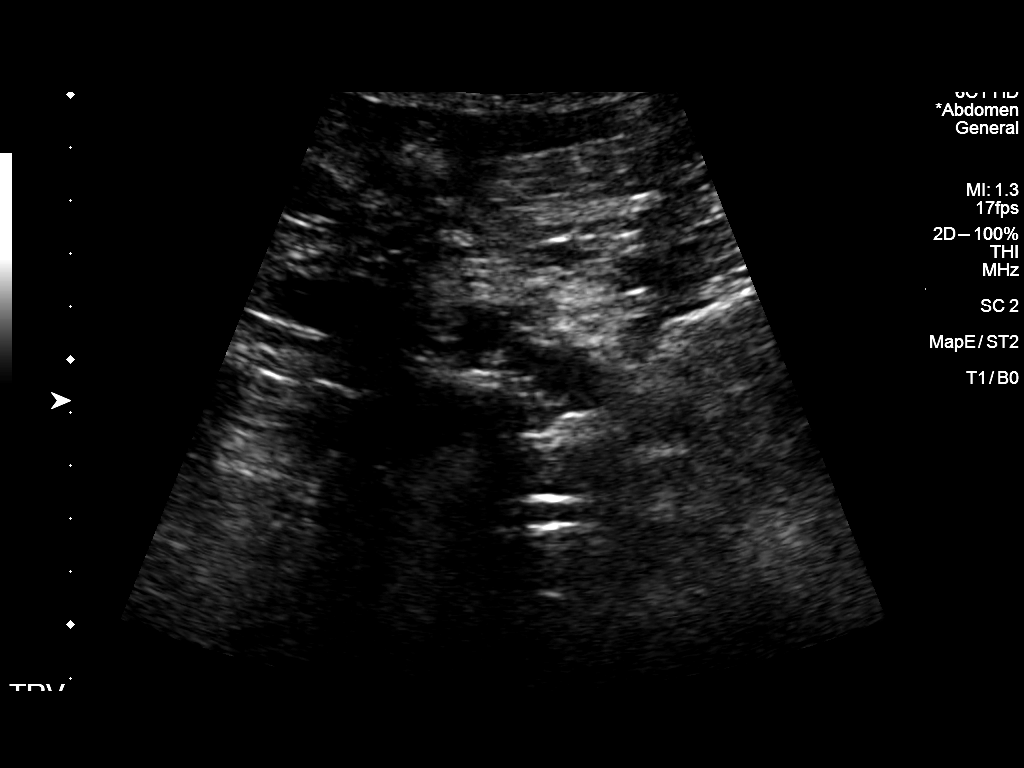

[7 of 7 positions shown; findings below may reference images not displayed]

FINDINGS: Abdominal aortic measurements as follows:

Proximal:  2.4 cm

Mid:  2.0 cm

Distal:  2.3 cm
IMPRESSION: Sonographic survey is negative for abdominal aortic aneurysm or
ectasia.

## 2020-02-01 ENCOUNTER — Inpatient Hospital Stay: Admission: RE | Admit: 2020-02-01 | Payer: Medicare Other | Source: Ambulatory Visit

## 2024-11-05 ENCOUNTER — Encounter (INDEPENDENT_AMBULATORY_CARE_PROVIDER_SITE_OTHER): Admitting: Ophthalmology

## 2024-11-05 NOTE — Progress Notes (Shared)
 [facility] - Clinic Note  11/05/2024   CHIEF COMPLAINT Patient presents for No chief complaint on file.  HISTORY OF PRESENT ILLNESS: Donald Watkins is a 73 y.o. male who presents to the clinic today for:   Referring physician: Vaughn Lauraine KATHEE DEVONNA [facility] [place],  [place] [address]  HISTORICAL INFORMATION:  Selected notes from the MEDICAL RECORD NUMBER Referred by Dr. JAMA:  Ocular Hx- PMH-   CURRENT MEDICATIONS: No current outpatient medications on file. (Ophthalmic Drugs)   No current facility-administered medications for this visit. (Ophthalmic Drugs)   Current Outpatient Medications (Other)  Medication Sig   clindamycin  (CLEOCIN ) 300 MG capsule Take 1 capsule (300 mg total) by mouth 3 (three) times daily.   hydrochlorothiazide (HYDRODIURIL) 25 MG tablet Take 25 mg by mouth daily.   losartan (COZAAR) 100 MG tablet Take 100 mg by mouth daily.   oxyCODONE -acetaminophen  (PERCOCET) 5-325 MG tablet Take 1 tablet by mouth every 4 (four) hours as needed.   tiZANidine (ZANAFLEX) 4 MG tablet Take 4 mg by mouth every 6 (six) hours as needed for muscle spasms.   No current facility-administered medications for this visit. (Other)   REVIEW OF SYSTEMS:  ALLERGIES Allergies  Allergen Reactions   Penicillins Hives   PAST MEDICAL HISTORY Past Medical History:  Diagnosis Date   Chronic back pain    had lumbar laminectomy, fusion 09-07-18 at [facility]   Hypertension    Smoker    Past Surgical History:  Procedure Laterality Date   LUMBAR FUSION     MULTIPLE EXTRACTIONS WITH ALVEOLOPLASTY Bilateral 11/01/2018   Procedure: MULTIPLE EXTRACTION WITH ALVEOLOPLASTY;  Surgeon: Sheryle Hamilton, DDS;  Location: [facility];  Service: Oral Surgery;  Laterality: Bilateral;   FAMILY HISTORY No family history on file. SOCIAL HISTORY Social History   Tobacco Use   Smoking status: Every Day    Current packs/day: 0.50    Types: Cigarettes    Smokeless tobacco: Former  Substance Use Topics   Alcohol use: Yes    Comment: rarely   Drug use: Not Currently       OPHTHALMIC EXAM:  Not recorded    IMAGING AND PROCEDURES  Imaging and Procedures for 11/05/2024        ASSESSMENT/PLAN: No diagnosis found. 1.  2. Hypertensive retinopathy OU - discussed importance of tight BP control - monitor   3.  Ophthalmic Meds Ordered this visit:  No orders of the defined types were placed in this encounter.    No follow-ups on file.  There are no Patient Instructions on file for this visit.  Explained the diagnoses, plan, and follow up with the patient and they expressed understanding.  Patient expressed understanding of the importance of proper follow up care.   This document serves as a record of services personally performed by Redell JUDITHANN Hans, MD, PhD. It was created on their behalf by Wanda GEANNIE Keens, COT an ophthalmic technician. The creation of this record is the provider's dictation and/or activities during the visit.    Electronically signed by:  Wanda GEANNIE Keens, COT  11/05/24 7:26 AM   Redell JUDITHANN Hans, M.D., Ph.D. Diseases & Surgery of the Retina and Vitreous [facility] 11/05/2024  Abbreviations: M myopia (nearsighted); A astigmatism; H hyperopia (farsighted); P presbyopia; Mrx spectacle prescription;  CTL contact lenses; OD right eye; OS left eye; OU both eyes  XT exotropia; ET esotropia; PEK punctate epithelial keratitis; PEE punctate epithelial erosions; DES  dry eye syndrome; MGD meibomian gland dysfunction; ATs artificial tears; PFAT's preservative free artificial tears; NSC nuclear sclerotic cataract; PSC posterior subcapsular cataract; ERM epi-retinal membrane; PVD posterior vitreous detachment; RD retinal detachment; DM diabetes mellitus; DR diabetic retinopathy; NPDR non-proliferative diabetic retinopathy; PDR proliferative diabetic retinopathy; CSME clinically significant  macular edema; DME diabetic macular edema; dbh dot blot hemorrhages; CWS cotton wool spot; POAG primary open angle glaucoma; C/D cup-to-disc ratio; HVF humphrey visual field; GVF goldmann visual field; OCT optical coherence tomography; IOP intraocular pressure; BRVO Branch retinal vein occlusion; CRVO central retinal vein occlusion; CRAO central retinal artery occlusion; BRAO branch retinal artery occlusion; RT retinal tear; SB scleral buckle; PPV pars plana vitrectomy; VH Vitreous hemorrhage; PRP panretinal laser photocoagulation; IVK intravitreal kenalog; VMT vitreomacular traction; MH Macular hole;  NVD neovascularization of the disc; NVE neovascularization elsewhere; AREDS age related eye disease study; ARMD age related macular degeneration; POAG primary open angle glaucoma; EBMD epithelial/anterior basement membrane dystrophy; ACIOL anterior chamber intraocular lens; IOL intraocular lens; PCIOL posterior chamber intraocular lens; Phaco/IOL phacoemulsification with intraocular lens placement; PRK photorefractive keratectomy; LASIK laser assisted in situ keratomileusis; HTN hypertension; DM diabetes mellitus; COPD chronic obstructive pulmonary disease

## 2024-11-06 ENCOUNTER — Encounter (INDEPENDENT_AMBULATORY_CARE_PROVIDER_SITE_OTHER): Admitting: Ophthalmology

## 2024-11-06 NOTE — Progress Notes (Signed)
 [facility] - Clinic Note  11/12/2024   CHIEF COMPLAINT Patient presents for Retina Evaluation  HISTORY OF PRESENT ILLNESS: Donald Watkins is a 73 y.o. male who presents to the clinic today for:  HPI     Retina Evaluation   In right eye.  This started 10 days ago.  Duration of 10 days.  Associated Symptoms Floaters and Photophobia.  Negative for Flashes, Distortion, Blind Spot, Pain, Redness, Glare, Trauma, Scalp Tenderness, Jaw Claudication, Shoulder/Hip pain, Fever, Weight Loss and Fatigue.        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 Pt states they seem to be getting thicker. Pt denies FOL/pain.      Last edited by Elnor Avelina RAMAN, COT on 11/12/2024  2:43 PM.     Patient states he is not seeing floaters.  Referring physician: Vaughn Lauraine [facility] [place],  [place] [address]  HISTORICAL INFORMATION:  Selected notes from the MEDICAL RECORD NUMBER Referred by Dr. JAMA:  Ocular Hx- PMH-   CURRENT MEDICATIONS: No current outpatient medications on file. (Ophthalmic Drugs)   No current facility-administered medications for this visit. (Ophthalmic Drugs)   Current Outpatient Medications (Other)  Medication Sig   clindamycin  (CLEOCIN ) 300 MG capsule Take 1 capsule (300 mg total) by mouth 3 (three) times daily.   hydrochlorothiazide (HYDRODIURIL) 25 MG tablet Take 25 mg by mouth daily.   losartan (COZAAR) 100 MG tablet Take 100 mg by mouth daily.   oxyCODONE -acetaminophen  (PERCOCET) 5-325 MG tablet Take 1 tablet by mouth every 4 (four) hours as needed.   tiZANidine (ZANAFLEX) 4 MG tablet Take 4 mg by mouth every 6 (six) hours as needed for muscle spasms.   No current facility-administered medications for this visit. (Other)   REVIEW OF  SYSTEMS: ROS   Positive for: Cardiovascular, Eyes, Respiratory Negative for: Constitutional, Gastrointestinal, Neurological, Skin, Genitourinary, Musculoskeletal, HENT, Endocrine, Psychiatric, Allergic/Imm, Heme/Lymph Last edited by Elnor Avelina RAMAN, COT on 11/12/2024  2:44 PM.     ALLERGIES Allergies  Allergen Reactions   Penicillins Hives   PAST MEDICAL HISTORY Past Medical History:  Diagnosis Date   Chronic back pain    had lumbar laminectomy, fusion 09-07-18 at [facility]   Hypertension    Smoker    Past Surgical History:  Procedure Laterality Date   LUMBAR FUSION     MULTIPLE EXTRACTIONS WITH ALVEOLOPLASTY Bilateral 11/01/2018   Procedure: MULTIPLE EXTRACTION WITH ALVEOLOPLASTY;  Surgeon: Sheryle Hamilton, DDS;  Location: [facility];  Service: Oral Surgery;  Laterality: Bilateral;   FAMILY HISTORY History reviewed. No pertinent family history. SOCIAL HISTORY Social History   Tobacco Use   Smoking status: Every Day    Current packs/day: 0.50    Types: Cigarettes   Smokeless tobacco: Former  Substance Use Topics   Alcohol use: Yes    Comment: rarely   Drug use: Not Currently       OPHTHALMIC EXAM:  Base Eye Exam     Visual Acuity (Snellen - Linear)       Right Left   Dist [place] 20/25 -2 20/25   Dist ph Globe 20/25 +1 20/25 +2         Tonometry (Tonopen, 2:37 PM)       Right Left   Pressure  14 12         Pupils       Pupils Dark Light Shape React APD   Right PERRL 3 2 Round Brisk None   Left PERRL 3 2 Round Brisk None         Visual Fields       Left Right    Full Full         Extraocular Movement       Right Left    Full, Ortho Full, Ortho         Neuro/Psych     Oriented x3: Yes         Dilation     Both eyes: 1.0% Mydriacyl, 2.5% Phenylephrine @ 2:39 PM           Slit Lamp and Fundus Exam     External Exam       Right Left   External Normal Normal         Slit Lamp Exam       Right Left    Lids/Lashes Dermatochalasis - upper lid Dermatochalasis - upper lid   Conjunctiva/Sclera Melanosis White and quiet   Cornea Well healed temporal cataract wound Debris in tear film, fine endo pigment   Anterior Chamber Deep and clear Deep and clear   Iris Round and dilated Round and dilated   Lens PC IOL in good postition PC IOL in good postition, trace Posterior capsular opacification   Anterior Vitreous Vitreous syneresis, Posterior vitreous detachment, mild blood stained Vitreous condensations turning white Vitreous syneresis         Fundus Exam       Right Left   Disc Pink and sharp Pink and sharp   C/D Ratio 0.2 0.2   Macula Flat, Good foveal reflex, No heme or edema, RPE mottling, no retinal whitening Flat, Good foveal reflex, No heme or edema, RPE mottling, no retinal whitening   Vessels Vascular attenuation, Tortuous, AV crossing changes, Copper wiring Vascular attenuation, Tortuous   Periphery Attached, No heme Attached, No heme           IMAGING AND PROCEDURES  Imaging and Procedures for 11/12/2024  OCT, Retina - OU - Both Eyes       Right Eye Quality was good. Central Foveal Thickness: 290. Progression has no prior data. Findings include normal foveal contour, no IRF, no SRF, retinal drusen (Focal ellipsoid disruption IN fovea, diffused fine vit opacities).   Left Eye Quality was good. Central Foveal Thickness: 287. Progression has no prior data. Findings include normal foveal contour, no IRF, no SRF, retinal drusen , intraretinal hyper-reflective material (Rare drusen, rare IRHM, partial PVD).   Notes *Images captured and stored on drive  Diagnosis / Impression:  OD: Focal ellipsoid disruption IN fovea, diffused fine vit opacities OS: Rare drusen, rare IRHM, partial PVD  Clinical management:  See below  Abbreviations: NFP - Normal foveal profile. CME - cystoid macular edema. PED - pigment epithelial detachment. IRF - intraretinal fluid. SRF - subretinal  fluid. EZ - ellipsoid zone. ERM - epiretinal membrane. ORA - outer retinal atrophy. ORT - outer retinal tubulation. SRHM - subretinal hyper-reflective material. IRHM - intraretinal hyper-reflective material      Fluorescein  Angiography Optos (Transit OD)       Right Eye Progression has no prior data. Early phase findings include blockage (Blockage from central vit opacities). Mid/Late phase findings include staining (Hyperfluorescence staining of disc, mild peri foveal staining nasal macula).   Left Eye  Progression has no prior data. Early phase findings include normal observations. Mid/Late phase findings include staining (Hyperfluorescence staining of disc).   Notes *Images captured and stored on drive  Diagnosis / Impression:  OD: Blockage from central vit opacities, Hyperfluorescence staining of disc, mild peri foveal staining nasal macula OS: Hyperfluorescence staining of disc  Clinical management:  See below  Abbreviations: NFP - Normal foveal profile. CME - cystoid macular edema. PED - pigment epithelial detachment. IRF - intraretinal fluid. SRF - subretinal fluid. EZ - ellipsoid zone. ERM - epiretinal membrane. ORA - outer retinal atrophy. ORT - outer retinal tubulation. SRHM - subretinal hyper-reflective material. IRHM - intraretinal hyper-reflective material           ASSESSMENT/PLAN:   ICD-10-CM   1. Vitreous hemorrhage of right eye (HCC)  H43.11     2. Hypertension, essential  I10     3. Hypertensive retinopathy of both eyes  H35.033 OCT, Retina - OU - Both Eyes    Fluorescein  Angiography Optos (Transit OD)    4. Pseudophakia  Z96.1      Hemorrhagic PVD, OD - OCT shows OD: Focal ellipsoid disruption IN fovea, diffused fine vit opacities, OS: Rare drusen, rare IRHM, partial PVD - FA 11.24.25 shows OD: Blockage from central vit opacities, Hyperfluorescence staining of disc, mild peri foveal staining nasal macula, OS: Hyperfluorescence staining of disc - exam  shows mild blood stained vitreous condensations turning white, trace pre retinal heme settled inferiorly - Discussed findings and prognosis - sleep with head elevated - Reviewed s/s of RT/RD - Strict return precautions for any such RT/RD signs/symptoms - f/u 6-8 weeks, DFE, OCT  2,3. Hypertensive retinopathy OU - discussed importance of tight BP control - monitor   3. Pseudophakia OU  - s/p CE/IOL OU w/ Dr. Octavia Sr.  - IOL in good position, doing well  - monitor   Ophthalmic Meds Ordered this visit:  No orders of the defined types were placed in this encounter.    No follow-ups on file.  There are no Patient Instructions on file for this visit.  Explained the diagnoses, plan, and follow up with the patient and they expressed understanding.  Patient expressed understanding of the importance of proper follow up care.   This document serves as a record of services personally performed by Redell JUDITHANN Hans, MD, PhD. It was created on their behalf by Avelina Pereyra, COA an ophthalmic technician. The creation of this record is the provider's dictation and/or activities during the visit.   Electronically signed by: Avelina GORMAN Pereyra, COT  11/12/24  4:06 PM   This document serves as a record of services personally performed by Redell JUDITHANN Hans, MD, PhD. It was created on their behalf by Wanda GEANNIE Keens, COT an ophthalmic technician. The creation of this record is the provider's dictation and/or activities during the visit.    Electronically signed by:  Wanda GEANNIE Keens, COT  11/12/24 4:06 PM  Redell JUDITHANN Hans, M.D., Ph.D. Diseases & Surgery of the Retina and Vitreous [facility] 11/12/2024  Abbreviations: M myopia (nearsighted); A astigmatism; H hyperopia (farsighted); P presbyopia; Mrx spectacle prescription;  CTL contact lenses; OD right eye; OS left eye; OU both eyes  XT exotropia; ET esotropia; PEK punctate epithelial keratitis; PEE punctate epithelial  erosions; DES dry eye syndrome; MGD meibomian gland dysfunction; ATs artificial tears; PFAT's preservative free artificial tears; NSC nuclear sclerotic cataract; PSC posterior subcapsular cataract; ERM epi-retinal membrane; PVD posterior vitreous detachment; RD  retinal detachment; DM diabetes mellitus; DR diabetic retinopathy; NPDR non-proliferative diabetic retinopathy; PDR proliferative diabetic retinopathy; CSME clinically significant macular edema; DME diabetic macular edema; dbh dot blot hemorrhages; CWS cotton wool spot; POAG primary open angle glaucoma; C/D cup-to-disc ratio; HVF humphrey visual field; GVF goldmann visual field; OCT optical coherence tomography; IOP intraocular pressure; BRVO Branch retinal vein occlusion; CRVO central retinal vein occlusion; CRAO central retinal artery occlusion; BRAO branch retinal artery occlusion; RT retinal tear; SB scleral buckle; PPV pars plana vitrectomy; VH Vitreous hemorrhage; PRP panretinal laser photocoagulation; IVK intravitreal kenalog; VMT vitreomacular traction; MH Macular hole;  NVD neovascularization of the disc; NVE neovascularization elsewhere; AREDS age related eye disease study; ARMD age related macular degeneration; POAG primary open angle glaucoma; EBMD epithelial/anterior basement membrane dystrophy; ACIOL anterior chamber intraocular lens; IOL intraocular lens; PCIOL posterior chamber intraocular lens; Phaco/IOL phacoemulsification with intraocular lens placement; PRK photorefractive keratectomy; LASIK laser assisted in situ keratomileusis; HTN hypertension; DM diabetes mellitus; COPD chronic obstructive pulmonary disease

## 2024-11-12 ENCOUNTER — Encounter (INDEPENDENT_AMBULATORY_CARE_PROVIDER_SITE_OTHER): Payer: Self-pay | Admitting: Ophthalmology

## 2024-11-12 ENCOUNTER — Ambulatory Visit (INDEPENDENT_AMBULATORY_CARE_PROVIDER_SITE_OTHER): Admitting: Ophthalmology

## 2024-11-12 DIAGNOSIS — H43811 Vitreous degeneration, right eye: Secondary | ICD-10-CM

## 2024-11-12 DIAGNOSIS — H3581 Retinal edema: Secondary | ICD-10-CM

## 2024-11-12 DIAGNOSIS — Z961 Presence of intraocular lens: Secondary | ICD-10-CM

## 2024-11-12 DIAGNOSIS — H35033 Hypertensive retinopathy, bilateral: Secondary | ICD-10-CM | POA: Diagnosis not present

## 2024-11-12 DIAGNOSIS — I10 Essential (primary) hypertension: Secondary | ICD-10-CM | POA: Diagnosis not present

## 2024-11-12 DIAGNOSIS — H4311 Vitreous hemorrhage, right eye: Secondary | ICD-10-CM | POA: Diagnosis not present

## 2024-11-13 ENCOUNTER — Encounter (INDEPENDENT_AMBULATORY_CARE_PROVIDER_SITE_OTHER): Admitting: Ophthalmology

## 2024-11-13 ENCOUNTER — Encounter (INDEPENDENT_AMBULATORY_CARE_PROVIDER_SITE_OTHER): Payer: Self-pay | Admitting: Ophthalmology

## 2024-12-28 NOTE — Progress Notes (Shared)
 " [facility] - Clinic Note  01/07/2025   CHIEF COMPLAINT Patient presents for No chief complaint on file.  HISTORY OF PRESENT ILLNESS: Donald Watkins is a 74 y.o. male who presents to the clinic today for:   Pt reports onset of floaters ~11.09.25  Referring physician: Clem Brands, PA-C  [facility] [address] [place],  [place] [address]  HISTORICAL INFORMATION:  Selected notes from the MEDICAL RECORD NUMBER Referred by Clem Brands, PA-C for possible partial RAO OD, acute PVD OD LEE:  Ocular Hx- PMH-   CURRENT MEDICATIONS: No current outpatient medications on file. (Ophthalmic Drugs)   No current facility-administered medications for this visit. (Ophthalmic Drugs)   Current Outpatient Medications (Other)  Medication Sig   clindamycin  (CLEOCIN ) 300 MG capsule Take 1 capsule (300 mg total) by mouth 3 (three) times daily.   hydrochlorothiazide (HYDRODIURIL) 25 MG tablet Take 25 mg by mouth daily.   losartan (COZAAR) 100 MG tablet Take 100 mg by mouth daily.   oxyCODONE -acetaminophen  (PERCOCET) 5-325 MG tablet Take 1 tablet by mouth every 4 (four) hours as needed.   tiZANidine (ZANAFLEX) 4 MG tablet Take 4 mg by mouth every 6 (six) hours as needed for muscle spasms.   No current facility-administered medications for this visit. (Other)   REVIEW OF SYSTEMS:   ALLERGIES Allergies  Allergen Reactions   Penicillins Hives   PAST MEDICAL HISTORY Past Medical History:  Diagnosis Date   Chronic back pain    had lumbar laminectomy, fusion 09-07-18 at [facility]   Hypertension    Smoker    Past Surgical History:  Procedure Laterality Date   LUMBAR FUSION     MULTIPLE EXTRACTIONS WITH ALVEOLOPLASTY Bilateral 11/01/2018   Procedure: MULTIPLE EXTRACTION WITH ALVEOLOPLASTY;  Surgeon: Sheryle Hamilton, DDS;  Location: [facility];  Service: Oral Surgery;  Laterality: Bilateral;   FAMILY HISTORY No family history  on file. SOCIAL HISTORY Social History   Tobacco Use   Smoking status: Every Day    Current packs/day: 0.50    Types: Cigarettes   Smokeless tobacco: Former  Substance Use Topics   Alcohol use: Yes    Comment: rarely   Drug use: Not Currently       OPHTHALMIC EXAM:  Not recorded    IMAGING AND PROCEDURES  Imaging and Procedures for 01/07/2025         ASSESSMENT/PLAN:   ICD-10-CM   1. Vitreous hemorrhage of right eye (HCC)  H43.11     2. Posterior vitreous detachment of right eye  H43.811     3. Hypertension, essential  I10     4. Hypertensive retinopathy of both eyes  H35.033     5. Pseudophakia  Z96.1       **Referred by Clem Brands, PA-C for possible partial RAO OD**  - FA 11.24.25 shows hypofluorescent blockage from central vit opacities, Hyperfluorescent staining of disc, mild perifoveal staining nasal macula -- no evidence of arterial occlusion  - no retinal whitening on exam; no retinal arterial plaque visualized  - monitor   1,2. Hemorrhagic PVD OD  - symptomatic floaters OD, onset ~2 wks ago (11.09.25) -- presented to Clem Brands, PA-C on 11.13.25  - Discussed findings and prognosis  - No RT or RD on 360 scleral depressed exam  - exam shows mild blood stained vit condensations already turning white, tr preretinal heme settled inferiorly  - Reviewed s/s of RT/RD  - Strict return precautions  for any such RT/RD signs/symptoms  - VH precautions reviewed -- minimize activities, keep head elevated, avoid ASA/NSAIDs/blood thinners as able  - F/U 6-8 wks - DFE/OCT  3,4. Hypertensive retinopathy OU - discussed importance of tight BP control - monitor   5. Pseudophakia OU  - s/p CE/IOL OU w/ Dr. Lamar Gaudy  - IOLs in good position, doing well  - monitor   Ophthalmic Meds Ordered this visit:  No orders of the defined types were placed in this encounter.    No follow-ups on file.  There are no Patient Instructions on file for this  visit.  Explained the diagnoses, plan, and follow up with the patient and they expressed understanding.  Patient expressed understanding of the importance of proper follow up care.   This document serves as a record of services personally performed by Redell JUDITHANN Hans, MD, PhD. It was created on their behalf by Paulina Jamse Gay an ophthalmic technician. The creation of this record is the provider's dictation and/or activities during the visit.   Electronically signed by: Paulina JONETTA Gay  12/28/2024  3:58 PM   Redell JUDITHANN Hans, M.D., Ph.D. Diseases & Surgery of the Retina and Vitreous [facility] 01/07/2025  Abbreviations: M myopia (nearsighted); A astigmatism; H hyperopia (farsighted); P presbyopia; Mrx spectacle prescription;  CTL contact lenses; OD right eye; OS left eye; OU both eyes  XT exotropia; ET esotropia; PEK punctate epithelial keratitis; PEE punctate epithelial erosions; DES dry eye syndrome; MGD meibomian gland dysfunction; ATs artificial tears; PFAT's preservative free artificial tears; NSC nuclear sclerotic cataract; PSC posterior subcapsular cataract; ERM epi-retinal membrane; PVD posterior vitreous detachment; RD retinal detachment; DM diabetes mellitus; DR diabetic retinopathy; NPDR non-proliferative diabetic retinopathy; PDR proliferative diabetic retinopathy; CSME clinically significant macular edema; DME diabetic macular edema; dbh dot blot hemorrhages; CWS cotton wool spot; POAG primary open angle glaucoma; C/D cup-to-disc ratio; HVF humphrey visual field; GVF goldmann visual field; OCT optical coherence tomography; IOP intraocular pressure; BRVO Branch retinal vein occlusion; CRVO central retinal vein occlusion; CRAO central retinal artery occlusion; BRAO branch retinal artery occlusion; RT retinal tear; SB scleral buckle; PPV pars plana vitrectomy; VH Vitreous hemorrhage; PRP panretinal laser photocoagulation; IVK intravitreal kenalog; VMT vitreomacular  traction; MH Macular hole;  NVD neovascularization of the disc; NVE neovascularization elsewhere; AREDS age related eye disease study; ARMD age related macular degeneration; POAG primary open angle glaucoma; EBMD epithelial/anterior basement membrane dystrophy; ACIOL anterior chamber intraocular lens; IOL intraocular lens; PCIOL posterior chamber intraocular lens; Phaco/IOL phacoemulsification with intraocular lens placement; PRK photorefractive keratectomy; LASIK laser assisted in situ keratomileusis; HTN hypertension; DM diabetes mellitus; COPD chronic obstructive pulmonary disease  "

## 2025-01-07 ENCOUNTER — Encounter (INDEPENDENT_AMBULATORY_CARE_PROVIDER_SITE_OTHER): Admitting: Ophthalmology

## 2025-01-07 DIAGNOSIS — Z961 Presence of intraocular lens: Secondary | ICD-10-CM

## 2025-01-07 DIAGNOSIS — H43811 Vitreous degeneration, right eye: Secondary | ICD-10-CM

## 2025-01-07 DIAGNOSIS — H35033 Hypertensive retinopathy, bilateral: Secondary | ICD-10-CM

## 2025-01-07 DIAGNOSIS — H4311 Vitreous hemorrhage, right eye: Secondary | ICD-10-CM

## 2025-01-07 DIAGNOSIS — I10 Essential (primary) hypertension: Secondary | ICD-10-CM
